# Patient Record
Sex: Male | Born: 1995 | Race: White | Hispanic: No | Marital: Single | Smoking: Never smoker
Health system: Southern US, Community
[De-identification: ages and names within clinical notes are randomized; demographics above are authoritative.]

---

## 2020-01-05 ENCOUNTER — Emergency Department
Admission: EM | Admit: 2020-01-05 | Discharge: 2020-01-05 | Disposition: A | Discharge status: Home / Self Care | Payer: Worker's Compensation | Attending: Emergency Medicine | Admitting: Emergency Medicine

## 2020-01-05 ENCOUNTER — Other Ambulatory Visit: Payer: Self-pay

## 2020-01-05 ENCOUNTER — Encounter: Payer: Self-pay | Admitting: Emergency Medicine

## 2020-01-05 ENCOUNTER — Emergency Department: Payer: Worker's Compensation

## 2020-01-05 DIAGNOSIS — W311XXA Contact with metalworking machines, initial encounter: Secondary | ICD-10-CM | POA: Diagnosis not present

## 2020-01-05 DIAGNOSIS — S6292XB Unspecified fracture of left wrist and hand, initial encounter for open fracture: Secondary | ICD-10-CM | POA: Diagnosis not present

## 2020-01-05 DIAGNOSIS — Y929 Unspecified place or not applicable: Secondary | ICD-10-CM | POA: Diagnosis not present

## 2020-01-05 DIAGNOSIS — Z23 Encounter for immunization: Secondary | ICD-10-CM | POA: Insufficient documentation

## 2020-01-05 DIAGNOSIS — S6992XA Unspecified injury of left wrist, hand and finger(s), initial encounter: Secondary | ICD-10-CM | POA: Diagnosis present

## 2020-01-05 DIAGNOSIS — Y9389 Activity, other specified: Secondary | ICD-10-CM | POA: Diagnosis not present

## 2020-01-05 DIAGNOSIS — Y990 Civilian activity done for income or pay: Secondary | ICD-10-CM | POA: Diagnosis not present

## 2020-01-05 DIAGNOSIS — S61432A Puncture wound without foreign body of left hand, initial encounter: Secondary | ICD-10-CM

## 2020-01-05 MED ORDER — DOXYCYCLINE HYCLATE 100 MG PO TABS: 100.0000 mg | ORAL_TABLET | Freq: Two times a day (BID) | ORAL | 0 refills | Status: AC

## 2020-01-05 MED ORDER — TETANUS-DIPHTH-ACELL PERTUSSIS 5-2.5-18.5 LF-MCG/0.5 IM SUSP
0.5000 mL | Freq: Once | INTRAMUSCULAR | Status: AC
  Administered 2020-01-05: 0.5 mL via INTRAMUSCULAR
  Filled 2020-01-05: qty 0.5

## 2020-01-05 MED ORDER — LIDOCAINE HCL (PF) 1 % IJ SOLN: 5.0000 mL | Freq: Once | INTRAMUSCULAR | Status: DC

## 2020-01-05 MED ORDER — LIDOCAINE HCL (PF) 1 % IJ SOLN
5.0000 mL | Freq: Once | INTRAMUSCULAR | Status: AC
  Administered 2020-01-05: 5 mL
  Filled 2020-01-05: qty 5

## 2020-01-05 MED ORDER — CEPHALEXIN 500 MG PO CAPS
500.0000 mg | ORAL_CAPSULE | Freq: Once | ORAL | Status: AC
  Administered 2020-01-05: 500 mg via ORAL
  Filled 2020-01-05: qty 1

## 2020-01-05 MED ORDER — LIDOCAINE HCL (PF) 1 % IJ SOLN
INTRAMUSCULAR | Status: AC
  Filled 2020-01-05: qty 5

## 2020-01-05 MED ORDER — CEPHALEXIN 500 MG PO CAPS: 500.0000 mg | ORAL_CAPSULE | Freq: Three times a day (TID) | ORAL | 0 refills | Status: AC

## 2020-01-05 NOTE — ED Notes (Signed)
Workers comp completed by [employer]. Urine was ambulated to the lab and placed in the refrigerator. Pt given [facility] paperwork .

## 2020-01-05 NOTE — ED Provider Notes (Signed)
[facility] Emergency Department Provider Note ____________________________________________  Time seen: 1123  I have reviewed the triage vital signs and the nursing notes.  HISTORY  Chief Complaint  Puncture Wound and Hand Pain  HPI Bryan Small is a 24 y.o. male presents to the ED for evaluation of a work-related injury. He reports he accidentally impaled his left hand with a drill bit.    He describes wearing a leather work glove at the time of the injury.  He denies any other injury at this time.  He reports normal composite fist and some tingling to the tip of his left ring finger.  He is unclear of his current tetanus status.  History reviewed. No pertinent past medical history.  There are no problems to display for this patient.  History reviewed. No pertinent surgical history.  Prior to Admission medications   Not on File    Allergies Patient has no allergy information on record.  History reviewed. No pertinent family history.  Social History Social History   Tobacco Use  . Smoking status: Not on file  Substance Use Topics  . Alcohol use: Not on file  . Drug use: Not on file    Review of Systems  Constitutional: Negative for fever. Cardiovascular: Negative for chest pain. Respiratory: Negative for shortness of breath. Gastrointestinal: Negative for abdominal pain, vomiting and diarrhea. Musculoskeletal: Negative for back pain. Left hand puncture wound Skin: Negative for rash. Neurological: Negative for headaches, focal weakness or numbness. ____________________________________________  PHYSICAL EXAM:  VITAL SIGNS: ED Triage Vitals  Enc Vitals Group     BP 134/80      Pulse 58      Resp 18      Temp 98.6 F     37 C oral     Temp src --      SpO2 100% RA     Weight 01/05/20 1114 150 lb (68 kg)     Height 01/05/20 1114 5\' 10"  (1.778 m)     Head Circumference --      Peak Flow --      Pain Score 01/05/20 1113 10     Pain  Loc --      Pain Edu? --      Excl. in [place]? --     Constitutional: Alert and oriented. Well appearing and in no distress. Head: Normocephalic and atraumatic. Eyes: Conjunctivae are normal. Normal extraocular movements Cardiovascular: Normal rate, regular rhythm. Normal distal pulses. Respiratory: Normal respiratory effort. No wheezes/rales/rhonchi. Musculoskeletal: Left hand with soft tissue disruption to the palmar aspect of the fourth MCP.  Patient also has a 1 cm laceration over the dorsum of the fourth MCP.  He is able demonstrate a normal composite fist on exam.  Nontender with normal range of motion in all extremities.  Neurologic:  Normal gross sensation.  Normal intrinsic and opposition testing noted.  Normal speech and language. No gross focal neurologic deficits are appreciated. Skin:  Skin is warm, dry and intact. No rash noted. Psychiatric: Mood and affect are normal. Patient exhibits appropriate insight and judgment. ____________________________________________   RADIOLOGY  DG Left Hand IMPRESSION: Comminuted fracture involving the volar and lateral aspects of the proximal fourth proximal phalanx, without definitive involvement of the metacarpophalangeal joint.  I, [name], personally viewed and evaluated these images (plain radiographs) as part of my medical decision making, as well as reviewing the written report by the radiologist. ____________________________________________  PROCEDURES  Tdap 0.5 ml IM Keflex 500  mg PO Cipro 500 mg PO  .Marland KitchenLaceration Repair  Date/Time: 01/05/2020 12:37 PM Performed by: Lissa Hoard, PA-C Authorized by: Lissa Hoard, PA-C   Consent:    Consent obtained:  Verbal   Consent given by:  Patient   Risks discussed:  Pain and poor wound healing Anesthesia (see MAR for exact dosages):    Anesthesia method:  Local infiltration   Local anesthetic:  Lidocaine 1% w/o epi Laceration details:     Location:  Hand   Hand location:  L hand, dorsum (& Palm)   Length (cm):  1   Depth (mm):  2 Repair type:    Repair type:  Simple Pre-procedure details:    Preparation:  Patient was prepped and draped in usual sterile fashion Treatment:    Area cleansed with:  Saline and Betadine   Amount of cleaning:  Standard Skin repair:    Repair method:  Sutures and Steri-Strips   Suture size:  4-0   Suture material:  Nylon   Suture technique:  Simple interrupted   Number of sutures:  2   Number of Steri-Strips:  6 Approximation:    Approximation:  Close Post-procedure details:    Dressing:  Non-adherent dressing and splint for protection   Patient tolerance of procedure:  Tolerated well, no immediate complications Comments:     Suture repair of the dorsal hand wound. Steri-strips were applied to the palmar laceration due to limited skin integrity.   Marland KitchenSplint Application  Date/Time: 01/05/2020 1:06 PM Performed by: Lissa Hoard, PA-C Authorized by: Lissa Hoard, PA-C   Consent:    Consent obtained:  Verbal   Consent given by:  Patient   Alternatives discussed:  No treatment Pre-procedure details:    Sensation:  Normal Procedure details:    Laterality:  Left   Location:  Hand   Hand:  L hand   Splint type:  Volar short arm   Supplies:  Elastic bandage, Ortho-Glass and cotton padding Post-procedure details:    Pain:  Improved   Sensation:  Normal   Patient tolerance of procedure:  Tolerated well, no immediate complications  ____________________________________________  INITIAL IMPRESSION / ASSESSMENT AND PLAN / ED COURSE ----------------------------------------- 1:34 PM on 01/05/2020 ----------------------------------------- S/W Dr. Odis Luster via ChartChat: he is agreeable to plan of care, RX, and outpatient follow-up with Dr. Stephenie Acres in his office.  Patient with ED evaluation of a work-related injury to the left palm.  Patient sustained an accidental puncture  wound to the left hand while using a 3/8 drill bit.  He sustained palmar soft tissue injury with disruption of the proximal portion of the fourth proximal phalanx.  No radiologic evidence of retained foreign body, and no intra-articular disruption of the MCP.  Patient smaller dorsal wound is closed with 2 sutures.  The palmar wound is closed using Steri-Strips.  A palmar splint is applied for wound protection and fracture management.  Patient is discharged with prescriptions for Keflex and doxycycline for antibiotic prophylaxis.  [facility] is [facility] to [facility] to follow-up with a hand specialist here for further fracture management.  Return precautions have been reviewed.  He is released to work with left hand use limited by splint/dressing.  Mainor Hellmann was evaluated in Emergency Department on 01/05/2020 for the symptoms described in the history of present illness. He was evaluated in the context of the global COVID-19 pandemic, which necessitated consideration that the patient might be at risk for infection with the SARS-CoV-2 virus  that causes COVID-19. Institutional protocols and algorithms that pertain to the evaluation of patients at risk for COVID-19 are in a state of rapid change based on information released by regulatory bodies including the CDC and federal and state organizations. These policies and algorithms were followed during the patient's care in the ED. ____________________________________________  FINAL CLINICAL IMPRESSION(S) / ED DIAGNOSES  Final diagnoses:  Puncture wound of left hand without foreign body, initial encounter  Open fracture of left hand, initial encounter      Lissa Hoard, PA-C 01/05/20 1509    Emily Filbert, MD 01/10/20 [phone]

## 2020-01-05 NOTE — Discharge Instructions (Addendum)
You are being treated for an open fracture to the palm of the left hand. Take the antibiotics as directed. Keep the wound clean, dry, and covered. Follow-up with Dr. Stephenie Acres (Ortho-Hand) for interim wound check and further fracture care. Return to the ED immediately for signs of infection.

## 2020-01-05 NOTE — ED Notes (Signed)
Imagining staff at bedside.

## 2020-01-05 NOTE — ED Notes (Signed)
Attempted workers comp. When pt provided urine sample, the thermometer strip on specimen cup did not change colors. Pt given a cup of water.

## 2020-01-05 NOTE — ED Notes (Signed)
Verbal okay from provider Menshew to give pt cup of water. Given.

## 2020-01-05 NOTE — ED Triage Notes (Signed)
Pt works for [employer].

## 2020-01-05 NOTE — ED Triage Notes (Signed)
Pt reports was at work and a drill but went through his left hand. Bleeding controlled at this time.   Pt boss, Berdine Dance requires [facility] UDS testing for injury.  Pt states that his tetanus is not up to date.

## 2021-06-23 IMAGING — DX DG HAND COMPLETE 3+V*L*
3 series · 3 of 3 positions shown · non-contrast
Comparison: None.

CLINICAL DATA: Puncture wound at the left fourth
metacarpophalangeal joint.

EXAM:
LEFT HAND - COMPLETE 3+ VIEW

[hand ap]
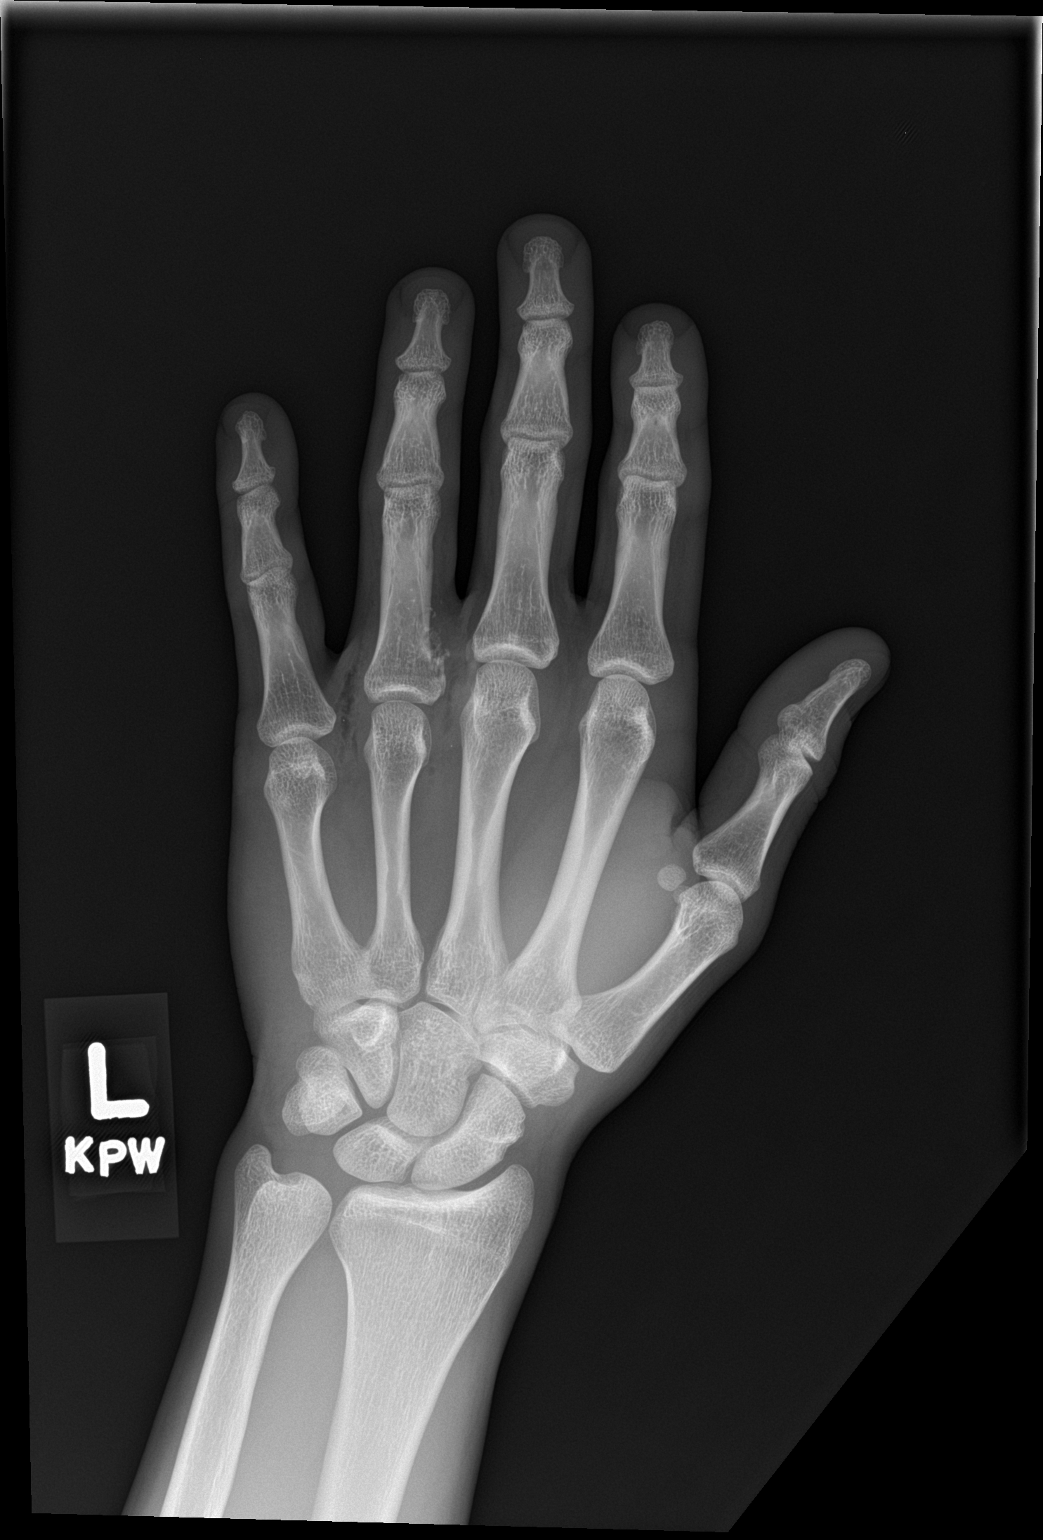

[hand obl]
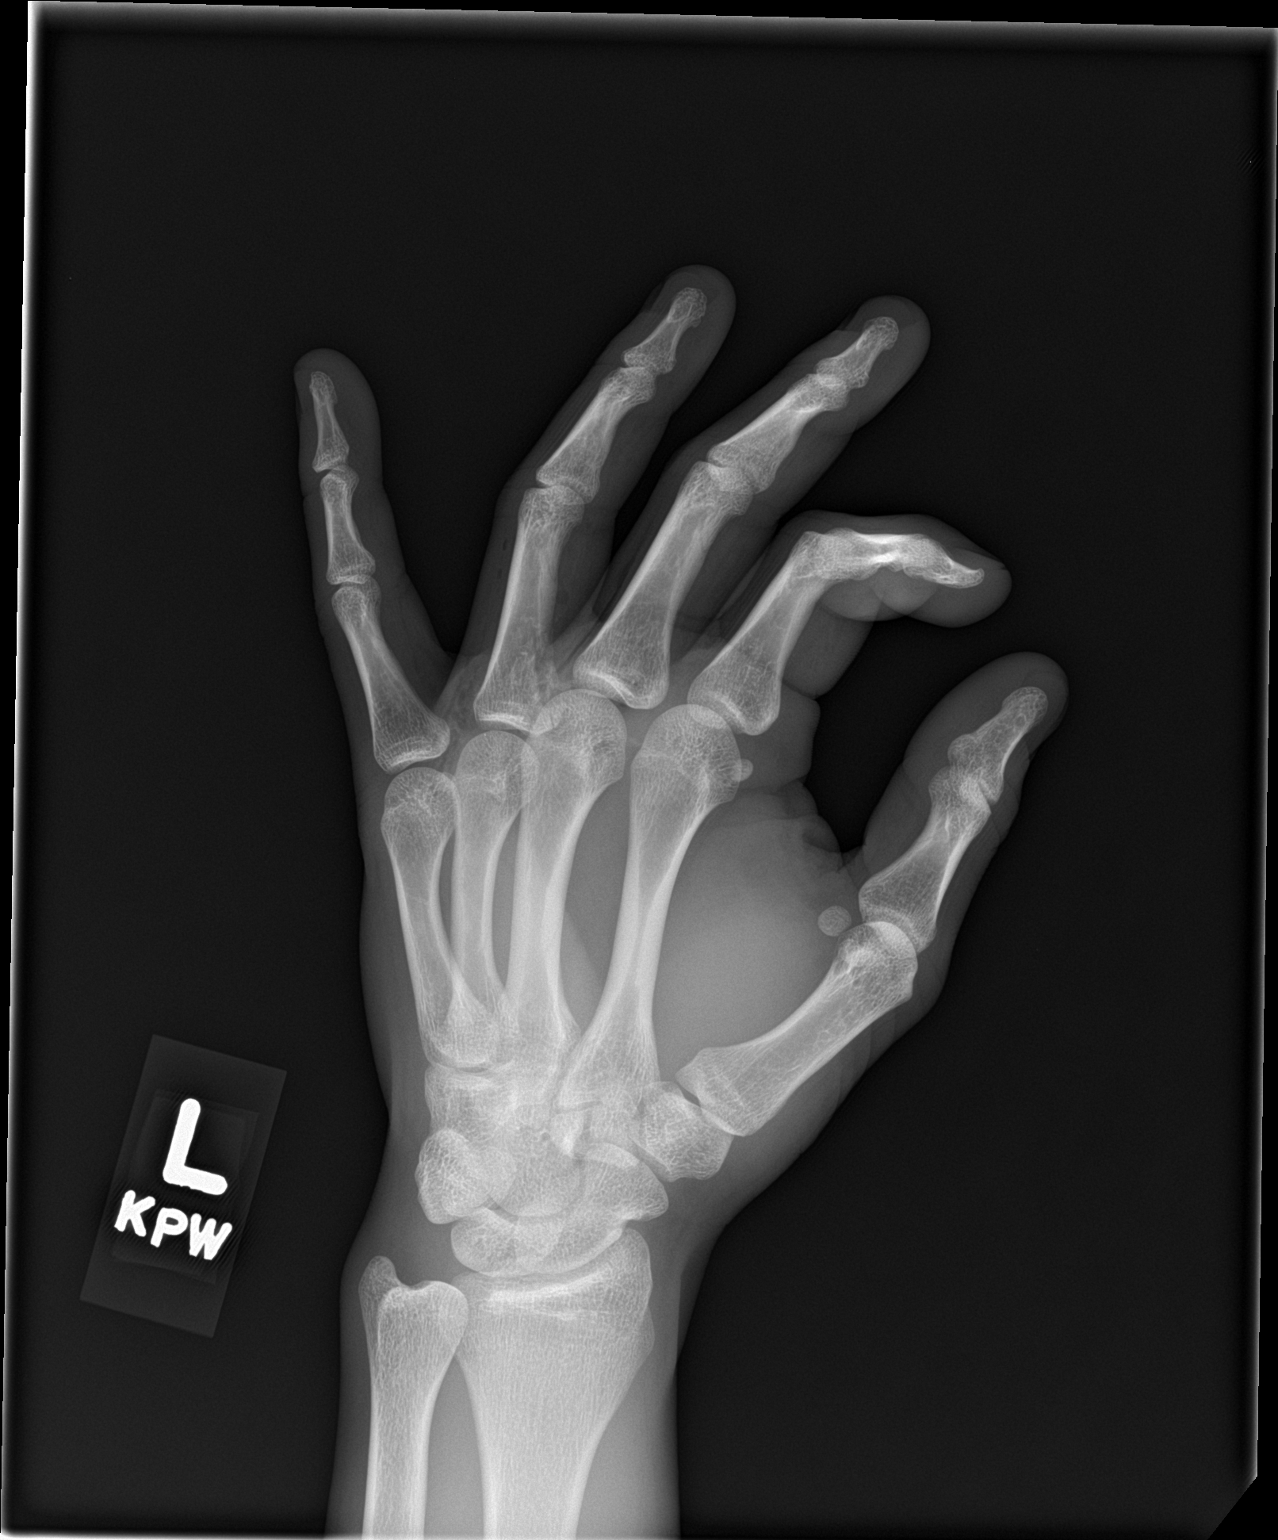

[hand lat]
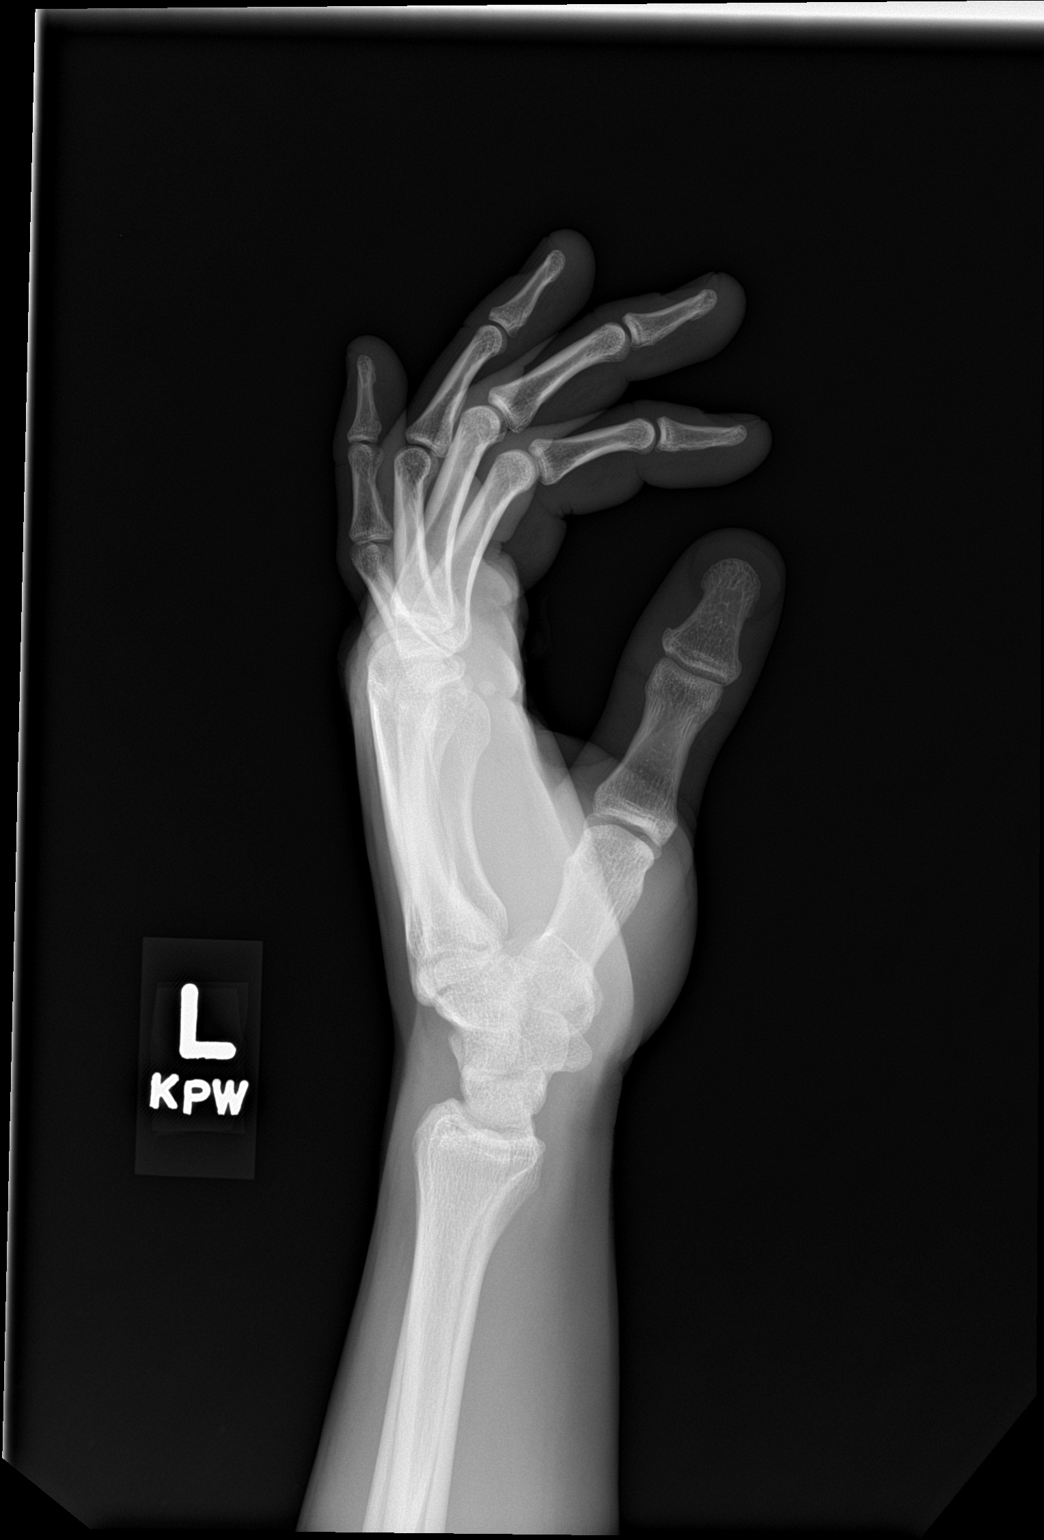

[3 of 3 positions shown; findings below may reference images not displayed]

FINDINGS: There is a comminuted fracture involving the volar and lateral
aspects proximal fourth proximal phalanx which extends proximally to
the cortex but may not be within the joint itself. Tiny bony
fragments are seen in the soft tissues. No definite metallic foreign
body. Subcutaneous emphysema is seen around the fourth
metacarpophalangeal joint.
IMPRESSION: Comminuted fracture involving the volar and lateral aspects of the
proximal fourth proximal phalanx, without definitive involvement of
the metacarpophalangeal joint.
